# Patient Record
Sex: Male | Born: 1965 | Race: Black or African American | Hispanic: No | Marital: Married | Smoking: Current every day smoker
Health system: Southern US, Community
[De-identification: ages and names within clinical notes are randomized; demographics above are authoritative.]

---

## 2005-09-07 ENCOUNTER — Emergency Department (HOSPITAL_COMMUNITY): Admission: EM | Admit: 2005-09-07 | Discharge: 2005-09-08 | Payer: Self-pay | Admitting: Emergency Medicine

## 2005-09-08 ENCOUNTER — Emergency Department (HOSPITAL_COMMUNITY): Admission: EM | Admit: 2005-09-08 | Discharge: 2005-09-08 | Payer: Self-pay | Admitting: Emergency Medicine
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
## 2018-09-21 ENCOUNTER — Emergency Department (HOSPITAL_COMMUNITY)
Admission: EM | Admit: 2018-09-21 | Discharge: 2018-09-21 | Disposition: A | Discharge status: Home / Self Care | Payer: Self-pay | Attending: Emergency Medicine | Admitting: Emergency Medicine

## 2018-09-21 ENCOUNTER — Other Ambulatory Visit: Payer: Self-pay

## 2018-09-21 ENCOUNTER — Encounter (HOSPITAL_COMMUNITY): Payer: Self-pay

## 2018-09-21 DIAGNOSIS — F17210 Nicotine dependence, cigarettes, uncomplicated: Secondary | ICD-10-CM | POA: Insufficient documentation

## 2018-09-21 DIAGNOSIS — J111 Influenza due to unidentified influenza virus with other respiratory manifestations: Secondary | ICD-10-CM | POA: Insufficient documentation

## 2018-09-21 DIAGNOSIS — Z209 Contact with and (suspected) exposure to unspecified communicable disease: Secondary | ICD-10-CM | POA: Insufficient documentation

## 2018-09-21 MED ORDER — OSELTAMIVIR PHOSPHATE 75 MG PO CAPS: 75.0000 mg | ORAL_CAPSULE | Freq: Two times a day (BID) | ORAL | 0 refills | Status: DC

## 2018-09-21 MED ORDER — BENZONATATE 100 MG PO CAPS: 100.0000 mg | ORAL_CAPSULE | Freq: Three times a day (TID) | ORAL | 0 refills | Status: DC

## 2018-09-21 MED ORDER — IBUPROFEN 600 MG PO TABS: 600.0000 mg | ORAL_TABLET | Freq: Four times a day (QID) | ORAL | 0 refills | Status: DC | PRN

## 2018-09-21 NOTE — ED Provider Notes (Signed)
[facility]-EMERGENCY DEPT Provider Note   CSN: [id number] Arrival date & time: 09/21/18  1531     History   Chief Complaint Chief Complaint  Patient presents with  . Flu Like Symptoms    HPI Roy Love is a 53 y.o. male.  The history is provided by the patient. No language interpreter was used.     53 year old male brought here for flulike symptoms.  Patient reports today he developed fever, chills, headache, body aches, congestion, sneezing, coughing, sore throat, nausea vomiting diarrhea and decrease in appetite.  Report recent sick contact.  He has tried some Robitussin with minimal improvement.  Symptoms moderate in severity.  Does not complain of any shortness of breath, severe abdominal cramping, dysuria, or rash.  He has not had a flu shot.  Patient lives in a shelter.  History reviewed. No pertinent past medical history.  There are no active problems to display for this patient.   History reviewed. No pertinent surgical history.      Home Medications    Prior to Admission medications   Not on File    Family History No family history on file.  Social History Social History   Tobacco Use  . Smoking status: Current Every Day Smoker    Packs/day: 0.50    Types: Cigarettes  . Smokeless tobacco: Never Used  Substance Use Topics  . Alcohol use: Yes    Alcohol/week: 100.0 standard drinks    Types: 100 Cans of beer per week  . Drug use: Yes    Types: Marijuana     Allergies   Patient has no known allergies.   Review of Systems Review of Systems  All other systems reviewed and are negative.    Physical Exam Updated Vital Signs BP (!) 149/95 (BP Location: Left Arm)   Pulse 87   Temp 98.9 F (37.2 C) (Oral)   Resp 16   Ht 6\' 3"  (1.905 m)   Wt 81.6 kg   SpO2 100%   BMI 22.50 kg/m   Physical Exam Vitals signs and nursing note reviewed.  Constitutional:      General: He is not in acute distress.    Appearance: He  is well-developed.  HENT:     Head: Atraumatic.     Right Ear: Tympanic membrane normal.     Left Ear: Tympanic membrane normal.     Nose: Nose normal.     Mouth/Throat:     Mouth: Mucous membranes are dry.  Eyes:     Conjunctiva/sclera: Conjunctivae normal.  Neck:     Musculoskeletal: Neck supple. No neck rigidity.  Cardiovascular:     Rate and Rhythm: Normal rate and regular rhythm.     Pulses: Normal pulses.     Heart sounds: Normal heart sounds.  Pulmonary:     Effort: Pulmonary effort is normal.     Breath sounds: Normal breath sounds. No wheezing.  Abdominal:     General: Abdomen is flat.     Tenderness: There is no abdominal tenderness.  Skin:    Findings: No rash.  Neurological:     Mental Status: He is alert and oriented to person, place, and time.      ED Treatments / Results  Labs (all labs ordered are listed, but only abnormal results are displayed) Labs Reviewed - No data to display  EKG None  Radiology No results found.  Procedures Procedures (including critical care time)  Medications Ordered in ED Medications - No data  to display   Initial Impression / Assessment and Plan / ED Course  I have reviewed the triage vital signs and the nursing notes.  Pertinent labs & imaging results that were available during my care of the patient were reviewed by me and considered in my medical decision making (see chart for details).     BP (!) 151/93   Pulse 92   Temp 98.9 F (37.2 C) (Oral)   Resp 18   Ht 6\' 3"  (1.905 m)   Wt 81.6 kg   SpO2 98%   BMI 22.50 kg/m    Final Clinical Impressions(s) / ED Diagnoses   Final diagnoses:  Flu syndrome    ED Discharge Orders         Ordered    oseltamivir (TAMIFLU) 75 MG capsule  Every 12 hours     09/21/18 1955    ibuprofen (ADVIL,MOTRIN) 600 MG tablet  Every 6 hours PRN     09/21/18 1955    benzonatate (TESSALON) 100 MG capsule  Every 8 hours     09/21/18 1955         Patient with symptoms  consistent with influenza.  Vitals are stable, low-grade fever.  No signs of dehydration, tolerating PO's.  Lungs are clear. Due to patient's presentation and physical exam a chest x-ray was not ordered bc likely diagnosis of flu.  Discussed the cost versus benefit of Tamiflu treatment with the patient.  The patient understands that symptoms are greater than the recommended 24-48 hour window of treatment.  Patient will be discharged with instructions to orally hydrate, rest, and use over-the-counter medications such as anti-inflammatories ibuprofen and Aleve for muscle aches and Tylenol for fever.  Patient will also be given a cough suppressant.     Fayrene Helper, PA-C 09/21/18 1956    Benjiman Core, MD 09/22/18 [phone]

## 2018-09-21 NOTE — ED Triage Notes (Addendum)
Pt BIBA from [facility]. Pt c/o flu like symptoms x 1 week. Pt c/o body aches, n/v/d, fatigue.

## 2018-09-23 MED FILL — OSELTAMIVIR PHOS 75 MG CAP: 75 | 5 days supply | Qty: 10 | Fill #0

## 2018-09-23 MED FILL — IBUPROFEN 600 MG TABLET: 600 | 8 days supply | Qty: 30 | Fill #0

## 2018-09-23 MED FILL — BENZONATATE 100 MG CAPS: 100 | 7 days supply | Qty: 21 | Fill #0

## 2018-09-23 NOTE — Congregational Nurse Program (Signed)
Saw this client this AM for c/o cold-like symptoms. 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 Client has never followed up after being released. Presented with Rx's , [facility] at [facility]. Scheduled to be seen Wed. With Dr. Delford Field at [facility]

## 2018-09-23 NOTE — Congregational Nurse Program (Signed)
  Dept: [phone]   Congregational Nurse Program Note  Date of Encounter: 09/23/2018  Past Medical History: No past medical history on file.  Encounter Details: CNP Questionnaire - 09/23/18 1248      Questionnaire   Patient Status  Not Applicable    Race  Black or African American    Location Patient Served At  [facility]    Insurance  Not Applicable    Uninsured  Uninsured (NEW 1x/quarter)    Food  Yes, have food insecurities    Housing/Utilities  No permanent housing    Transportation  No transportation needs    Interpersonal Safety  Yes, feel physically and emotionally safe where you currently live    Medication  Provided medication assistance    Medical Provider  No    Referrals  Primary Care Provider/Clinic    ED Visit Averted  Not Applicable    Life-Saving Intervention Made  Not Applicable

## 2018-09-23 NOTE — Congregational Nurse Program (Signed)
  Dept: [phone]   Congregational Nurse Program Note  Date of Encounter: 09/23/2018  Past Medical History: No past medical history on file.  Encounter Details: CNP Questionnaire - 09/23/18 1248      Questionnaire   Patient Status  Not Applicable    Race  Black or African American    Location Patient Served At  [facility]    Insurance  Not Applicable    Uninsured  Uninsured (NEW 1x/quarter)    Food  Yes, have food insecurities    Housing/Utilities  No permanent housing    Transportation  No transportation needs    Interpersonal Safety  Yes, feel physically and emotionally safe where you currently live    Medication  Provided medication assistance    Medical Provider  No    Referrals  Primary Care Provider/Clinic    ED Visit Averted  Not Applicable    Life-Saving Intervention Made  Not Applicable        

## 2018-09-25 ENCOUNTER — Other Ambulatory Visit: Payer: Self-pay | Admitting: Critical Care Medicine

## 2018-09-25 ENCOUNTER — Encounter: Payer: Self-pay | Admitting: Critical Care Medicine

## 2018-09-25 MED ORDER — AZITHROMYCIN 250 MG PO TABS: ORAL_TABLET | ORAL | 0 refills | Status: AC

## 2018-09-25 MED ORDER — BENZONATATE 100 MG PO CAPS: 100.0000 mg | ORAL_CAPSULE | Freq: Three times a day (TID) | ORAL | 0 refills | Status: AC | PRN

## 2018-09-25 MED ORDER — IBUPROFEN 600 MG PO TABS: 600.0000 mg | ORAL_TABLET | Freq: Four times a day (QID) | ORAL | 0 refills | Status: AC | PRN

## 2018-09-25 MED FILL — AZITHROMYCIN 250 MG TABLET: 250 | 5 days supply | Qty: 6 | Fill #0

## 2018-09-25 MED FILL — BENZONATATE 100 MG CAPS: 100 | 7 days supply | Qty: 21 | Fill #0

## 2018-09-25 MED FILL — IBUPROFEN 600 MG TABLET: 600 | 7 days supply | Qty: 30 | Fill #0

## 2018-09-25 NOTE — Progress Notes (Signed)
See documentation.

## 2018-09-25 NOTE — Progress Notes (Signed)
This is a 53 year old African-American male who was seen on 25 January in emergency room with cough no fever runny nose weakness wheezing sinus drainage and some dyspnea on exertion.  The cough is productive of thick yellow mucus.  The patient had some degree of myalgias.  There is some green material coming out of the nose.  The patient does smoke.  Patient has no medication allergies.  The patient is self-pay.  No medications were given for the patient in the emergency room.  No x-ray imaging studies were obtained.  The patient has no previous medical history and is on no chronic medications.   On exam temperature is 98 saturation 100% room air pulse 82 blood pressure 132/82.  Patient is not ill-appearing chest showed scattered rhonchi no wheeze   cardiac exam showed a regular rate and rhythm without S3 or S4  Throat showed erythema posteriorly without purulence  My impression is patient has a post viral tracheobronchitis I do not believe he had acute flu  Plan would be to give a course of azithromycin 500 mg x 1 dose then 250 mg daily thereafter for a 5-day course  Benzonatate was given for cough suppression along with ibuprofen 600 mg every 8 hours as needed for myalgias  We will check this patient again next week in the clinic

## 2019-03-27 ENCOUNTER — Ambulatory Visit: Payer: Self-pay | Admitting: Orthopaedic Surgery

## 2019-04-28 ENCOUNTER — Encounter (HOSPITAL_COMMUNITY): Payer: Self-pay

## 2019-04-28 ENCOUNTER — Other Ambulatory Visit: Payer: Self-pay

## 2019-04-28 ENCOUNTER — Emergency Department (HOSPITAL_COMMUNITY)
Admission: EM | Admit: 2019-04-28 | Discharge: 2019-04-28 | Disposition: A | Discharge status: Home / Self Care | Payer: Self-pay | Attending: Emergency Medicine | Admitting: Emergency Medicine

## 2019-04-28 DIAGNOSIS — F17210 Nicotine dependence, cigarettes, uncomplicated: Secondary | ICD-10-CM | POA: Insufficient documentation

## 2019-04-28 DIAGNOSIS — F1290 Cannabis use, unspecified, uncomplicated: Secondary | ICD-10-CM | POA: Insufficient documentation

## 2019-04-28 DIAGNOSIS — R1032 Left lower quadrant pain: Secondary | ICD-10-CM | POA: Insufficient documentation

## 2019-04-28 DIAGNOSIS — R109 Unspecified abdominal pain: Secondary | ICD-10-CM

## 2019-04-28 LAB — COMPREHENSIVE METABOLIC PANEL
ALT: 12 U/L (ref 0–44)
AST: 18 U/L (ref 15–41)
Albumin: 3.9 g/dL (ref 3.5–5.0)
Alkaline Phosphatase: 73 U/L (ref 38–126)
Anion gap: 7 (ref 5–15)
BUN: 17 mg/dL (ref 6–20)
CO2: 25 mmol/L (ref 22–32)
Calcium: 8.7 mg/dL — ABNORMAL LOW (ref 8.9–10.3)
Chloride: 106 mmol/L (ref 98–111)
Creatinine, Ser: 1.31 mg/dL — ABNORMAL HIGH (ref 0.61–1.24)
GFR calc Af Amer: >60 mL/min (ref >60)
GFR calc non Af Amer: >60 mL/min (ref >60)
Glucose, Bld: 95 mg/dL (ref 70–99)
Potassium: 4 mmol/L (ref 3.5–5.1)
Sodium: 138 mmol/L (ref 135–145)
Total Bilirubin: 0.3 mg/dL (ref 0.3–1.2)
Total Protein: 6.9 g/dL (ref 6.5–8.1)

## 2019-04-28 LAB — URINALYSIS, ROUTINE W REFLEX MICROSCOPIC
Glucose, UA: NEGATIVE mg/dL
Hgb urine dipstick: NEGATIVE
Ketones, ur: 5 mg/dL — AB
Leukocytes,Ua: NEGATIVE
Nitrite: NEGATIVE
Protein, ur: 30 mg/dL — AB
Specific Gravity, Urine: 1.031 — ABNORMAL HIGH (ref 1.005–1.030)
pH: 5 (ref 5.0–8.0)

## 2019-04-28 LAB — CBC
HCT: 36.7 % — ABNORMAL LOW (ref 39.0–52.0)
Hemoglobin: 11.8 g/dL — ABNORMAL LOW (ref 13.0–17.0)
MCH: 32.2 pg (ref 26.0–34.0)
MCHC: 32.2 g/dL (ref 30.0–36.0)
MCV: 100.3 fL — ABNORMAL HIGH (ref 80.0–100.0)
Platelets: 258 10*3/uL (ref 150–400)
RBC: 3.66 MIL/uL — ABNORMAL LOW (ref 4.22–5.81)
RDW: 13.6 % (ref 11.5–15.5)
WBC: 5.4 10*3/uL (ref 4.0–10.5)
nRBC: 0 % (ref 0.0–0.2)

## 2019-04-28 LAB — LIPASE, BLOOD: Lipase: 26 U/L (ref 11–51)

## 2019-04-28 MED ORDER — IBUPROFEN 400 MG PO TABS
600.0000 mg | ORAL_TABLET | Freq: Once | ORAL | Status: AC
  Administered 2019-04-28: 600 mg via ORAL
  Filled 2019-04-28: qty 1

## 2019-04-28 MED ORDER — SODIUM CHLORIDE 0.9% FLUSH: 3.0000 mL | Freq: Once | INTRAVENOUS | Status: DC

## 2019-04-28 MED ORDER — METHOCARBAMOL 500 MG PO TABS: 500.0000 mg | ORAL_TABLET | Freq: Two times a day (BID) | ORAL | 0 refills | Status: AC

## 2019-04-28 NOTE — ED Provider Notes (Signed)
[facility] EMERGENCY DEPARTMENT Provider Note   CSN: [id number] Arrival date & time: 04/28/19  1322     History   Chief Complaint Chief Complaint  Patient presents with  . Abdominal Pain    HPI Roy Love is a 53 y.o. male.     HPI   Pt is a 53 y/o male with a h/o HTN who presents to the ED today c/o pain to the left groin that has been present for the last week. He states the pain feels sharp. He states it started suddenly upon standing. He does a lot of heavy lifting at work. Pain is constant and is worse when he stands up or walks.  States he feels like it is swollen but he cannot actually see swelling to the area.   He denies fevers, NVD, constipation, dysuria, hematuria, penile discharge, pain/swelling/redness to testicles or scrotum.  Reports urinary frequency. He is currently sexually active with his wife only. They do not use protection.  He reports history of nephrolithiasis.  History reviewed. No pertinent past medical history.  There are no active problems to display for this patient.  History reviewed. No pertinent surgical history.    Home Medications    Prior to Admission medications   Medication Sig Start Date End Date Taking? Authorizing Provider  azithromycin (ZITHROMAX) 250 MG tablet Take two once then one daily until gone 09/25/18   Elsie Stain, MD  benzonatate (TESSALON) 100 MG capsule Take 1 capsule (100 mg total) by mouth 3 (three) times daily as needed for cough. 09/25/18   Elsie Stain, MD  ibuprofen (ADVIL,MOTRIN) 600 MG tablet Take 1 tablet (600 mg total) by mouth every 6 (six) hours as needed. 09/25/18   Elsie Stain, MD  methocarbamol (ROBAXIN) 500 MG tablet Take 1 tablet (500 mg total) by mouth 2 (two) times daily for 5 days. 04/28/19 05/03/19  Kayne Yuhas S, PA-C    Family History History reviewed. No pertinent family history.  Social History Social History   Tobacco Use  . Smoking status: Current  Every Day Smoker    Packs/day: 0.50    Types: Cigarettes  . Smokeless tobacco: Never Used  Substance Use Topics  . Alcohol use: Yes    Alcohol/week: 100.0 standard drinks    Types: 100 Cans of beer per week  . Drug use: Yes    Types: Marijuana     Allergies   Patient has no known allergies.   Review of Systems Review of Systems  Constitutional: Negative for fever.  HENT: Negative for ear pain and sore throat.   Eyes: Negative for visual disturbance.  Respiratory: Negative for cough and shortness of breath.   Cardiovascular: Negative for chest pain.  Gastrointestinal: Positive for abdominal pain. Negative for constipation, diarrhea, nausea and vomiting.  Genitourinary: Positive for frequency. Negative for discharge, dysuria, hematuria, scrotal swelling and testicular pain.  Musculoskeletal: Negative for back pain.  Skin: Negative for rash.  Neurological: Negative for headaches.  All other systems reviewed and are negative.    Physical Exam Updated Vital Signs BP (!) 146/80 (BP Location: Left Arm)   Pulse 77   Temp 98.5 F (36.9 C) (Oral)   Resp 18   SpO2 100%   Physical Exam Vitals signs and nursing note reviewed.  Constitutional:      Appearance: He is well-developed.  HENT:     Head: Normocephalic and atraumatic.  Eyes:     Conjunctiva/sclera: Conjunctivae normal.  Neck:  Musculoskeletal: Neck supple.  Cardiovascular:     Rate and Rhythm: Normal rate and regular rhythm.     Heart sounds: No murmur.  Pulmonary:     Effort: Pulmonary effort is normal. No respiratory distress.     Breath sounds: Normal breath sounds.  Abdominal:     Palpations: Abdomen is soft.     Tenderness: There is abdominal tenderness (left inguinal region) in the left lower quadrant. There is left CVA tenderness.     Comments: No palpable hernia. LLQ and left inguinal pain exacerbated with hip flexion.   Skin:    General: Skin is warm and dry.  Neurological:     Mental Status:  He is alert.    ED Treatments / Results  Labs (all labs ordered are listed, but only abnormal results are displayed) Labs Reviewed  COMPREHENSIVE METABOLIC PANEL - Abnormal; Notable for the following components:      Result Value   Creatinine, Ser 1.31 (*)    Calcium 8.7 (*)    All other components within normal limits  CBC - Abnormal; Notable for the following components:   RBC 3.66 (*)    Hemoglobin 11.8 (*)    HCT 36.7 (*)    MCV 100.3 (*)    All other components within normal limits  URINALYSIS, ROUTINE W REFLEX MICROSCOPIC - Abnormal; Notable for the following components:   Specific Gravity, Urine 1.031 (*)    Bilirubin Urine SMALL (*)    Ketones, ur 5 (*)    Protein, ur 30 (*)    Bacteria, UA RARE (*)    All other components within normal limits  LIPASE, BLOOD    EKG None  Radiology No results found.  Procedures Procedures (including critical care time)  Medications Ordered in ED Medications  sodium chloride flush (NS) 0.9 % injection 3 mL (has no administration in time range)  ibuprofen (ADVIL) tablet 600 mg (600 mg Oral Given 04/28/19 1721)     Initial Impression / Assessment and Plan / ED Course  I have reviewed the triage vital signs and the nursing notes.  Pertinent labs & imaging results that were available during my care of the patient were reviewed by me and considered in my medical decision making (see chart for details).      Final Clinical Impressions(s) / ED Diagnoses   Final diagnoses:  Abdominal pain, unspecified abdominal location   53 year old male presenting with left inguinal pain after doing some heavy lifting about a week ago.  He has associated urinary frequency but no other associated symptoms.  On exam he does have tenderness to the left lower quadrant and left inguinal region.  No palpable swollen lymph nodes or palpable hernias.  His pain is exacerbated with flexion of the left hip.  He also does have some left CVA tenderness.   Laboratory work ordered in triage. CBC with no leukocytosis.  Anemia present but not severe. CMP with mildly elevated creatinine at 1.3, otherwise normal LFTs and bilirubin.  No gross electrolyte derangement Lipase is negative UA with small bilirubinuria, ketonuria and proteinuria.  No nitrites or leukocytes.  11-20 RBCs noted.  Rare bacteria present.  With patient's left CVA tenderness and tenderness of the left inguinal region, there is concern for possible ureterolithiasis, inguinal/femoral hernia.  Symptoms may also represent muscle strain given history of heavy lifting and exacerbation of pain with flexion of the left hip.  I did recommend CT imaging to rule out hernia/nephrolithiasis.  He had to leave prior  to obtaining this study because he had to pick his children up.  I discussed the possible diagnosis.  He is aware.  I will give him Robaxin for treatment of possible muscle strain.  He states that he will return to the ED if symptoms continue or worsen.  All questions answered.  Patient discharged in stable condition.  ED Discharge Orders         Ordered    methocarbamol (ROBAXIN) 500 MG tablet  2 times daily     04/28/19 1828           Karrie MeresCouture, Basya Casavant S, PA-C 04/28/19 1835    Melene PlanFloyd, Dan, DO 04/28/19 1914

## 2019-04-28 NOTE — Discharge Instructions (Signed)
You were given a prescription for Robaxin which is a muscle relaxer.  You should not drive, work, or operate machinery while taking this medication as it can make you very drowsy.    Please return to the emergency department for any new or worsening symptoms.

## 2019-04-28 NOTE — ED Triage Notes (Signed)
Pt reports that he has pain in his lower abdomen on the left side that radiates down into his left leg. Pt reports that he has had this pain for over a year but the pain worsened over the last month. Pt reports he feels like he has a golf ball in his groin. Pt denies having N/V.
